# Patient Record
Sex: Male | Born: 1965 | Race: White | Hispanic: No | Marital: Married | Smoking: Never smoker
Health system: Southern US, Community
[De-identification: ages and names within clinical notes are randomized; demographics above are authoritative.]

## PROBLEM LIST (undated history)

## (undated) DIAGNOSIS — E785 Hyperlipidemia, unspecified: Secondary | ICD-10-CM

## (undated) DIAGNOSIS — I499 Cardiac arrhythmia, unspecified: Secondary | ICD-10-CM

## (undated) HISTORY — PX: OTHER SURGICAL HISTORY: SHX169

---

## 2014-05-07 ENCOUNTER — Emergency Department (HOSPITAL_BASED_OUTPATIENT_CLINIC_OR_DEPARTMENT_OTHER): Payer: PRIVATE HEALTH INSURANCE

## 2014-05-07 ENCOUNTER — Inpatient Hospital Stay (HOSPITAL_BASED_OUTPATIENT_CLINIC_OR_DEPARTMENT_OTHER)
Admission: EM | Admit: 2014-05-07 | Discharge: 2014-05-09 | DRG: 247 | Disposition: A | Discharge status: Home / Self Care | Payer: PRIVATE HEALTH INSURANCE | Attending: Cardiology | Admitting: Cardiology

## 2014-05-07 ENCOUNTER — Encounter (HOSPITAL_BASED_OUTPATIENT_CLINIC_OR_DEPARTMENT_OTHER): Payer: Self-pay | Admitting: Emergency Medicine

## 2014-05-07 DIAGNOSIS — I25111 Atherosclerotic heart disease of native coronary artery with angina pectoris with documented spasm: Secondary | ICD-10-CM | POA: Diagnosis not present

## 2014-05-07 DIAGNOSIS — Z87891 Personal history of nicotine dependence: Secondary | ICD-10-CM

## 2014-05-07 DIAGNOSIS — R079 Chest pain, unspecified: Secondary | ICD-10-CM | POA: Diagnosis present

## 2014-05-07 DIAGNOSIS — I2510 Atherosclerotic heart disease of native coronary artery without angina pectoris: Secondary | ICD-10-CM | POA: Diagnosis present

## 2014-05-07 DIAGNOSIS — I214 Non-ST elevation (NSTEMI) myocardial infarction: Principal | ICD-10-CM | POA: Diagnosis present

## 2014-05-07 DIAGNOSIS — E785 Hyperlipidemia, unspecified: Secondary | ICD-10-CM | POA: Diagnosis present

## 2014-05-07 HISTORY — DX: Hyperlipidemia, unspecified: E78.5

## 2014-05-07 HISTORY — DX: Cardiac arrhythmia, unspecified: I49.9

## 2014-05-07 LAB — CBC WITH DIFFERENTIAL/PLATELET
BASOS ABS: 0 10*3/uL (ref 0.0–0.1)
BASOS PCT: 0 % (ref 0–1)
Eosinophils Absolute: 0.1 10*3/uL (ref 0.0–0.7)
Eosinophils Relative: 1 % (ref 0–5)
HEMATOCRIT: 42.7 % (ref 39.0–52.0)
Hemoglobin: 14.4 g/dL (ref 13.0–17.0)
LYMPHS PCT: 33 % (ref 12–46)
Lymphs Abs: 1.9 10*3/uL (ref 0.7–4.0)
MCH: 31 pg (ref 26.0–34.0)
MCHC: 33.7 g/dL (ref 30.0–36.0)
MCV: 91.8 fL (ref 78.0–100.0)
Monocytes Absolute: 0.5 10*3/uL (ref 0.1–1.0)
Monocytes Relative: 9 % (ref 3–12)
NEUTROS ABS: 3.3 10*3/uL (ref 1.7–7.7)
Neutrophils Relative %: 57 % (ref 43–77)
PLATELETS: 262 10*3/uL (ref 150–400)
RBC: 4.65 MIL/uL (ref 4.22–5.81)
RDW: 12.9 % (ref 11.5–15.5)
WBC: 5.8 10*3/uL (ref 4.0–10.5)

## 2014-05-07 LAB — COMPREHENSIVE METABOLIC PANEL
ALT: 27 U/L (ref 0–53)
ANION GAP: 16 — AB (ref 5–15)
AST: 22 U/L (ref 0–37)
Albumin: 4.3 g/dL (ref 3.5–5.2)
Alkaline Phosphatase: 73 U/L (ref 39–117)
BILIRUBIN TOTAL: 0.7 mg/dL (ref 0.3–1.2)
BUN: 16 mg/dL (ref 6–23)
CALCIUM: 10 mg/dL (ref 8.4–10.5)
CHLORIDE: 98 meq/L (ref 96–112)
CO2: 26 meq/L (ref 19–32)
Creatinine, Ser: 0.8 mg/dL (ref 0.50–1.35)
GFR calc non Af Amer: >90 mL/min (ref >90)
GLUCOSE: 126 mg/dL — AB (ref 70–99)
Potassium: 3.7 mEq/L (ref 3.7–5.3)
Sodium: 140 mEq/L (ref 137–147)
Total Protein: 7.9 g/dL (ref 6.0–8.3)

## 2014-05-07 LAB — PROTIME-INR
INR: 1.03 (ref 0.00–1.49)
PROTHROMBIN TIME: 13.5 s (ref 11.6–15.2)

## 2014-05-07 LAB — LIPASE, BLOOD: LIPASE: 19 U/L (ref 11–59)

## 2014-05-07 LAB — TROPONIN I: Troponin I: 0.57 ng/mL (ref <0.30)

## 2014-05-07 LAB — PRO B NATRIURETIC PEPTIDE: PRO B NATRI PEPTIDE: 52 pg/mL (ref 0–125)

## 2014-05-07 LAB — APTT: aPTT: 30 seconds (ref 24–37)

## 2014-05-07 MED ORDER — HEPARIN (PORCINE) IN NACL 100-0.45 UNIT/ML-% IJ SOLN
16.0000 [IU]/kg/h | INTRAMUSCULAR | Status: DC
Start: 2014-05-07 — End: 2014-05-08
  Administered 2014-05-07: 16 [IU]/kg/h via INTRAVENOUS
  Filled 2014-05-07: qty 250

## 2014-05-07 MED ORDER — HEPARIN BOLUS VIA INFUSION
60.0000 [IU]/kg | Freq: Once | INTRAVENOUS | Status: AC
  Administered 2014-05-07: 4764 [IU] via INTRAVENOUS

## 2014-05-07 MED ORDER — FAMOTIDINE 20 MG PO TABS
20.0000 mg | ORAL_TABLET | Freq: Once | ORAL | Status: AC
  Administered 2014-05-07: 20 mg via ORAL
  Filled 2014-05-07: qty 1

## 2014-05-07 MED ORDER — ASPIRIN 81 MG PO CHEW
324.0000 mg | CHEWABLE_TABLET | Freq: Once | ORAL | Status: AC
  Administered 2014-05-07: 324 mg via ORAL
  Filled 2014-05-07: qty 4

## 2014-05-07 NOTE — ED Provider Notes (Signed)
CSN: [id number]     Arrival date & time 05/07/14  2044 History  This chart was scribed for Arby BarretteMarcy Montgomery Favor, MD by Roxy Cedarhandni Bhalodia, ED Scribe. This patient was seen in room MH03/MH03 and the patient's care was started at 9:03 PM.   Chief Complaint  Patient presents with  . Chest Pain   The history is provided by the patient. No language interpreter was used.   HPI Comments: George Rose is a 48 y.o. male with a history of irregular heart beats and hyperlipidemia, who presents to the Emergency Department complaining of intermittent upper central chest pain that feels like a "pulling" sensation. He states that the discomfort sensation in his chest radiates up to his neck.  He reports associated dry mouth that began earlier today. Patient states that he has had a cough in the morning when he wakes up for the past 2 weeks. He states that he took advil earlier today and motrin 1 hour ago with minimal relief. He states that today is the first time he has felt this sensation. He denies any prior history of cardiac problems. He states that his father had a heart attack at age 50 and is still living today. Patient denies any activity that worsens his pain. Patient denies associated lightheadedness, nausea, emesis, sweating, diarrhea, edema in legs. Patient states he is currently a non-smoker and quit smoking 5 years ago. The pain has been waxing and waning throughout the course of the day. He denies it has been worse with exertion. The patient denies associated symptoms. No prior history of similar. Past Medical History  Diagnosis Date  . Irregular heart beat   . Hyperlipemia    Past Surgical History  Procedure Laterality Date  . Kidney stones     History reviewed. No pertinent family history. History  Substance Use Topics  . Smoking status: Never Smoker   . Smokeless tobacco: Never Used  . Alcohol Use: Yes   Review of Systems  10 Systems reviewed and all are negative for acute change except  as noted in the HPI.  Allergies  Review of patient's allergies indicates no known allergies.  Home Medications   Prior to Admission medications   Not on File   Triage Vitals: BP 166/95  Pulse 115  Temp(Src) 98.6 F (37 C) (Oral)  Ht 6' (1.829 m)  Wt 175 lb (79.379 kg)  BMI 23.73 kg/m2  SpO2 99%  Physical Exam  Nursing note and vitals reviewed. Constitutional: He is oriented to person, place, and time. He appears well-developed and well-nourished. No distress.  HENT:  Head: Normocephalic and atraumatic.  Eyes: Conjunctivae and EOM are normal.  Neck: Neck supple. No tracheal deviation present.  Cardiovascular: Normal rate, regular rhythm and normal heart sounds.  Exam reveals no gallop and no friction rub.   No murmur heard. Pulmonary/Chest: Effort normal. No respiratory distress.  Musculoskeletal: Normal range of motion.  Neurological: He is alert and oriented to person, place, and time.  Skin: Skin is warm and dry.  Psychiatric: He has a normal mood and affect. His behavior is normal.   ED Course  Procedures (including critical care time)  DIAGNOSTIC STUDIES: Oxygen Saturation is 99% on RA, normal by my interpretation.    COORDINATION OF CARE: 9:21 PM- Discussed plans to order diagnostic CXR and lab work. Will give patient chewable aspirin and Pepcid. Pt advised of plan for treatment and pt agrees.  Labs Review Labs Reviewed  COMPREHENSIVE METABOLIC PANEL - Abnormal; Notable for the following:  Glucose, Bld 126 (*)    Anion gap 16 (*)    All other components within normal limits  TROPONIN I - Abnormal; Notable for the following:    Troponin I 0.57 (*)    All other components within normal limits  LIPASE, BLOOD  CBC WITH DIFFERENTIAL  PRO B NATRIURETIC PEPTIDE  APTT  PROTIME-INR    Imaging Review Dg Chest 2 View  05/07/2014   CLINICAL DATA:  48 year old male with cough x2 weeks. Intermittent chest pain.  EXAM: CHEST - 2 VIEW  COMPARISON:  None.   FINDINGS: Cardiomediastinal within normal limits in size and contour. No pulmonary vascular congestion.  No visualized pneumothorax or pleural effusion. No confluent airspace disease.  No acute bony abnormality. Unremarkable appearance of the upper abdomen.  IMPRESSION: No radiographic evidence of acute cardiopulmonary disease.  Signed,  Yvone NeuJaime S. Loreta AveWagner, DO  [facility]  [facility]   Electronically Signed   By: Gilmer MorJaime  Wagner D.O.   On: 05/07/2014 21:29     EKG Interpretation   Date/Time:  Sunday May 07 2014 20:51:42 EDT Ventricular Rate:  106 PR Interval:  160 QRS Duration: 72 QT Interval:  328 QTC Calculation: 435 R Axis:   58 Text Interpretation:  Sinus tachycardia ST \\T \ T wave abnormality,  consider lateral ischemia Abnormal ECG agree Confirmed by Donnald GarrePfeiffer, MD,  Lebron ConnersMarcy [phone] on 05/07/2014 9:00:14 PM      Consultation has been placed with cardiology. The patient will be started on heparin drip and transferred to [facility] for further management.  CRITICAL CARE Performed by: Arby BarrettePfeiffer, Haidy Kackley   Total critical care time: 30  Critical care time was exclusive of separately billable procedures and treating other patients.  Critical care was necessary to treat or prevent imminent or life-threatening deterioration.  Critical care was time spent personally by me on the following activities: development of treatment plan with patient and/or surrogate as well as nursing, discussions with consultants, evaluation of patient's response to treatment, examination of patient, obtaining history from patient or surrogate, ordering and performing treatments and interventions, ordering and review of laboratory studies, ordering and review of radiographic studies, pulse oximetry and re-evaluation of patient's condition. MDM   Final diagnoses:  NSTEMI (non-ST elevated myocardial infarction)   at this point time patient's findings are consistent  with a and STEMI. EKG does show ST depression but no acute ST elevation MI. With a ischemic appearing EKG and elevated troponins patient will be transferred for further cardiology evaluation and treatment. He has been given aspirin in the emergency department here and will be treated with heparin as well.    I personally performed the services described in this documentation, which was scribed in my presence. The recorded information has been reviewed and is accurate.  Arby BarretteMarcy Hiral Lukasiewicz, MD 05/08/14 [phone]

## 2014-05-07 NOTE — ED Notes (Addendum)
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

## 2014-05-07 NOTE — ED Notes (Signed)
Critical troponin 0.57 received from lab.  EDP notified.

## 2014-05-08 ENCOUNTER — Encounter (HOSPITAL_COMMUNITY)
Admission: EM | Disposition: A | Discharge status: Home / Self Care | Payer: PRIVATE HEALTH INSURANCE | Source: Home / Self Care | Attending: Cardiology

## 2014-05-08 DIAGNOSIS — I214 Non-ST elevation (NSTEMI) myocardial infarction: Secondary | ICD-10-CM

## 2014-05-08 DIAGNOSIS — I2510 Atherosclerotic heart disease of native coronary artery without angina pectoris: Secondary | ICD-10-CM

## 2014-05-08 HISTORY — PX: LEFT HEART CATHETERIZATION WITH CORONARY ANGIOGRAM: SHX5451

## 2014-05-08 LAB — LIPID PANEL
Cholesterol: 231 mg/dL — ABNORMAL HIGH (ref 0–200)
HDL: 53 mg/dL (ref >39)
LDL CALC: 158 mg/dL — AB (ref 0–99)
Total CHOL/HDL Ratio: 4.4 RATIO
Triglycerides: 102 mg/dL (ref <150)
VLDL: 20 mg/dL (ref 0–40)

## 2014-05-08 LAB — CBC
HEMATOCRIT: 40.4 % (ref 39.0–52.0)
HEMOGLOBIN: 13.9 g/dL (ref 13.0–17.0)
MCH: 31 pg (ref 26.0–34.0)
MCHC: 34.4 g/dL (ref 30.0–36.0)
MCV: 90.2 fL (ref 78.0–100.0)
Platelets: 240 10*3/uL (ref 150–400)
RBC: 4.48 MIL/uL (ref 4.22–5.81)
RDW: 12.9 % (ref 11.5–15.5)
WBC: 5.6 10*3/uL (ref 4.0–10.5)

## 2014-05-08 LAB — BASIC METABOLIC PANEL
Anion gap: 13 (ref 5–15)
BUN: 14 mg/dL (ref 6–23)
CHLORIDE: 103 meq/L (ref 96–112)
CO2: 27 mEq/L (ref 19–32)
CREATININE: 0.79 mg/dL (ref 0.50–1.35)
Calcium: 9.2 mg/dL (ref 8.4–10.5)
Glucose, Bld: 104 mg/dL — ABNORMAL HIGH (ref 70–99)
Potassium: 4 mEq/L (ref 3.7–5.3)
Sodium: 143 mEq/L (ref 137–147)

## 2014-05-08 LAB — MRSA PCR SCREENING: MRSA by PCR: NEGATIVE

## 2014-05-08 LAB — TROPONIN I: Troponin I: 1.03 ng/mL (ref <0.30)

## 2014-05-08 LAB — HEPARIN LEVEL (UNFRACTIONATED): Heparin Unfractionated: 0.48 IU/mL (ref 0.30–0.70)

## 2014-05-08 LAB — HEMOGLOBIN A1C
HEMOGLOBIN A1C: 5.7 % — AB (ref <5.7)
MEAN PLASMA GLUCOSE: 117 mg/dL — AB (ref <117)

## 2014-05-08 LAB — POCT ACTIVATED CLOTTING TIME: Activated Clotting Time: 371 seconds

## 2014-05-08 SURGERY — LEFT HEART CATHETERIZATION WITH CORONARY ANGIOGRAM
Anesthesia: LOCAL

## 2014-05-08 MED ORDER — TICAGRELOR 90 MG PO TABS
90.0000 mg | ORAL_TABLET | Freq: Two times a day (BID) | ORAL | Status: DC
  Administered 2014-05-09: 90 mg via ORAL
  Filled 2014-05-08 (×2): qty 1

## 2014-05-08 MED ORDER — BIVALIRUDIN 250 MG IV SOLR
INTRAVENOUS | Status: AC
  Filled 2014-05-08: qty 250

## 2014-05-08 MED ORDER — SODIUM CHLORIDE 0.9 % IJ SOLN
3.0000 mL | Freq: Two times a day (BID) | INTRAMUSCULAR | Status: DC
  Administered 2014-05-08 – 2014-05-09 (×3): 3 mL via INTRAVENOUS

## 2014-05-08 MED ORDER — HEPARIN (PORCINE) IN NACL 2-0.9 UNIT/ML-% IJ SOLN
INTRAMUSCULAR | Status: AC
  Filled 2014-05-08: qty 1000

## 2014-05-08 MED ORDER — TICAGRELOR 90 MG PO TABS
ORAL_TABLET | ORAL | Status: AC
  Filled 2014-05-08: qty 2

## 2014-05-08 MED ORDER — ATORVASTATIN CALCIUM 80 MG PO TABS
80.0000 mg | ORAL_TABLET | ORAL | Status: AC
  Administered 2014-05-08: 80 mg via ORAL
  Filled 2014-05-08: qty 1

## 2014-05-08 MED ORDER — ASPIRIN 81 MG PO CHEW
81.0000 mg | CHEWABLE_TABLET | ORAL | Status: AC
  Administered 2014-05-08: 81 mg via ORAL
  Filled 2014-05-08: qty 1

## 2014-05-08 MED ORDER — NITROGLYCERIN 0.4 MG SL SUBL: 0.4000 mg | SUBLINGUAL_TABLET | SUBLINGUAL | Status: DC | PRN

## 2014-05-08 MED ORDER — SODIUM CHLORIDE 0.9 % IJ SOLN: 3.0000 mL | INTRAMUSCULAR | Status: DC | PRN

## 2014-05-08 MED ORDER — FENTANYL CITRATE 0.05 MG/ML IJ SOLN
INTRAMUSCULAR | Status: AC
  Filled 2014-05-08: qty 2

## 2014-05-08 MED ORDER — LIDOCAINE HCL (PF) 1 % IJ SOLN
INTRAMUSCULAR | Status: AC
  Filled 2014-05-08: qty 30

## 2014-05-08 MED ORDER — HEPARIN SODIUM (PORCINE) 1000 UNIT/ML IJ SOLN
INTRAMUSCULAR | Status: AC
  Filled 2014-05-08: qty 1

## 2014-05-08 MED ORDER — ATORVASTATIN CALCIUM 80 MG PO TABS
80.0000 mg | ORAL_TABLET | Freq: Every day | ORAL | Status: DC
  Filled 2014-05-08: qty 1

## 2014-05-08 MED ORDER — MIDAZOLAM HCL 2 MG/2ML IJ SOLN
INTRAMUSCULAR | Status: AC
  Filled 2014-05-08: qty 2

## 2014-05-08 MED ORDER — SODIUM CHLORIDE 0.9 % IV SOLN
INTRAVENOUS | Status: AC
  Administered 2014-05-08 (×2): via INTRAVENOUS

## 2014-05-08 MED ORDER — HEPARIN (PORCINE) IN NACL 100-0.45 UNIT/ML-% IJ SOLN
1250.0000 [IU]/h | INTRAMUSCULAR | Status: DC
  Administered 2014-05-08 (×2): 1250 [IU]/h via INTRAVENOUS
  Filled 2014-05-08 (×2): qty 250

## 2014-05-08 MED ORDER — VERAPAMIL HCL 2.5 MG/ML IV SOLN
INTRAVENOUS | Status: AC
  Filled 2014-05-08: qty 2

## 2014-05-08 MED ORDER — SODIUM CHLORIDE 0.9 % IV SOLN
INTRAVENOUS | Status: AC
  Administered 2014-05-08: 100 mL/h via INTRAVENOUS

## 2014-05-08 MED ORDER — ACETAMINOPHEN 325 MG PO TABS: 650.0000 mg | ORAL_TABLET | ORAL | Status: DC | PRN

## 2014-05-08 MED ORDER — SODIUM CHLORIDE 0.9 % IV SOLN: 250.0000 mL | INTRAVENOUS | Status: DC | PRN

## 2014-05-08 MED ORDER — ONDANSETRON HCL 4 MG/2ML IJ SOLN: 4.0000 mg | Freq: Four times a day (QID) | INTRAMUSCULAR | Status: DC | PRN

## 2014-05-08 MED ORDER — SODIUM CHLORIDE 0.9 % IV SOLN
INTRAVENOUS | Status: DC
  Administered 2014-05-08: 11:00:00 via INTRAVENOUS

## 2014-05-08 MED ORDER — ASPIRIN EC 81 MG PO TBEC
81.0000 mg | DELAYED_RELEASE_TABLET | Freq: Every day | ORAL | Status: DC
  Administered 2014-05-09: 81 mg via ORAL
  Filled 2014-05-08 (×2): qty 1

## 2014-05-08 MED ORDER — OXYCODONE-ACETAMINOPHEN 5-325 MG PO TABS
1.0000 | ORAL_TABLET | ORAL | Status: DC | PRN
  Administered 2014-05-08: 1 via ORAL
  Filled 2014-05-08: qty 1

## 2014-05-08 MED ORDER — CARVEDILOL 6.25 MG PO TABS
6.2500 mg | ORAL_TABLET | Freq: Two times a day (BID) | ORAL | Status: DC
  Administered 2014-05-08 – 2014-05-09 (×3): 6.25 mg via ORAL
  Filled 2014-05-08 (×5): qty 1

## 2014-05-08 NOTE — Progress Notes (Signed)
PROGRESS NOTE  Subjective:   48 yo from [place], presents with NSTEMI. CP is better. Troponin is elevated.  For cath today  Objective:    Vital Signs:   Temp:  [98.2 F (36.8 C)-98.6 F (37 C)] 98.2 F (36.8 C) (10/19 0747) Pulse Rate:  [72-115] 83 (10/19 1000) Resp:  [13-25] 22 (10/19 1000) BP: (121-189)/(71-109) 129/81 mmHg (10/19 1000) SpO2:  [95 %-100 %] 98 % (10/19 1000) Weight:  [168 lb 3.4 oz (76.3 kg)-175 lb (79.379 kg)] 168 lb 3.4 oz (76.3 kg) (10/19 0217)      24-hour weight change: Weight change:   Weight trends: Filed Weights   05/07/14 2047 05/08/14 0217  Weight: 175 lb (79.379 kg) 168 lb 3.4 oz (76.3 kg)    Intake/Output:  10/18 0701 - 10/19 0700 In: 784.4 [I.V.:784.4] Out: 350 [Urine:350] Total I/O In: 347.5 [I.V.:347.5] Out: 500 [Urine:500]   Physical Exam: BP 129/81  Pulse 83  Temp(Src) 98.2 F (36.8 C) (Oral)  Resp 22  Ht 6' (1.829 m)  Wt 168 lb 3.4 oz (76.3 kg)  BMI 22.81 kg/m2  SpO2 98%  Wt Readings from Last 3 Encounters:  05/08/14 168 lb 3.4 oz (76.3 kg)  05/08/14 168 lb 3.4 oz (76.3 kg)    General: Vital signs reviewed and noted.   Head: Normocephalic, atraumatic.  Eyes: conjunctivae/corneas clear.  EOM's intact.   Throat: normal  Neck:  normal   Lungs:    clear  Heart:  Rr  Abdomen:  Soft, non-tender, non-distended    Extremities: No edema. Pulses are 2+    Neurologic: A&O X3, CN II - XII are grossly intact.   Psych: Normal     Labs: BMET:  Recent Labs  05/07/14 2108 05/08/14 0512  NA 140 143  K 3.7 4.0  CL 98 103  CO2 26 27  GLUCOSE 126* 104*  BUN 16 14  CREATININE 0.80 0.79  CALCIUM 10.0 9.2    Liver function tests:  Recent Labs  05/07/14 2108  AST 22  ALT 27  ALKPHOS 73  BILITOT 0.7  PROT 7.9  ALBUMIN 4.3    Recent Labs  05/07/14 2108  LIPASE 19    CBC:  Recent Labs  05/07/14 2108 05/08/14 0512  WBC 5.8 5.6  NEUTROABS 3.3  --   HGB 14.4 13.9  HCT 42.7 40.4  MCV 91.8  90.2  PLT 262 240    Cardiac Enzymes:  Recent Labs  05/07/14 2043 05/08/14 0512  TROPONINI 0.57* 1.03*    Coagulation Studies:  Recent Labs  05/07/14 2108  LABPROT 13.5  INR 1.03    Other: No components found with this basename: POCBNP,  No results found for this basename: DDIMER,  in the last 72 hours No results found for this basename: HGBA1C,  in the last 72 hours  Recent Labs  05/08/14 0528  CHOL 231*  HDL 53  LDLCALC 158*  TRIG 102  CHOLHDL 4.4   No results found for this basename: TSH, T4TOTAL, FREET3, T3FREE, THYROIDAB,  in the last 72 hours No results found for this basename: VITAMINB12, FOLATE, FERRITIN, TIBC, IRON, RETICCTPCT,  in the last 72 hours   Other results:  EKG :    NSR, ST depression     Medications:    Infusions: . heparin 1,250 Units/hr (05/08/14 0215)    Scheduled Medications: . [COMPLETED] sodium chloride   Intravenous STAT  . aspirin EC  81 mg Oral Daily  . atorvastatin  80 mg Oral q1800  . carvedilol  6.25 mg Oral BID WC    Assessment/ Plan:   Active Problems:   NSTEMI (non-ST elevated myocardial infarction) we have discussed risks / benefits  / options of cardiac cath. He understands and agrees to proceed.   Disposition: for cath todahy  Length of Stay: 1   2. Hyperlipidemia:  Will start atorvastatin 80   Patrecia Veiga J. Jordynn Marcella, Jr., MD, FACC 05/08/2014, 11:04 AM Office [phone] Pager [phone]    

## 2014-05-08 NOTE — Progress Notes (Signed)
Utilization Review Completed.George Rose T10/19/2015  

## 2014-05-08 NOTE — Interval H&P Note (Signed)
History and Physical Interval Note:  05/08/2014 6:48 PM  George Rose  has presented today for surgery, with the diagnosis of cp  The various methods of treatment have been discussed with the patient and family. After consideration of risks, benefits and other options for treatment, the patient has consented to  Procedure(s): LEFT HEART CATHETERIZATION WITH CORONARY ANGIOGRAM (N/A) as a surgical intervention .  The patient's history has been reviewed, patient examined, no change in status, stable for surgery.  I have reviewed the patient's chart and labs.  Questions were answered to the patient's satisfaction.    Cath Lab Visit (complete for each Cath Lab visit)  Clinical Evaluation Leading to the Procedure:   ACS: Yes.    Non-ACS:    Anginal Classification: CCS IV  Anti-ischemic medical therapy: Minimal Therapy (1 class of medications)  Non-Invasive Test Results: No non-invasive testing performed  Prior CABG: No previous CABG  George BollmanMichael Dacotah Cabello MD 05/08/2014 6:50 PM

## 2014-05-08 NOTE — Progress Notes (Signed)
To the cath. Lab by bed. 

## 2014-05-08 NOTE — CV Procedure (Signed)
    Cardiac Catheterization Procedure Note  Name: George ChingMauro Rose MRN: [id number] DOB: 18-Mar-1966  Procedure: Left Heart Cath, Selective Coronary Angiography, LV angiography, PTCA and stenting of the mid-left circumflex  Indication: NSTEMI. 48 [place] with fam hx of CAD and hyperlipidemia presented with typical symptoms of ACS and was troponin positive. He is referred for cath and possible PCI.  Procedural Details:  The right wrist was prepped, draped, and anesthetized with 1% lidocaine. Using the modified Seldinger technique, a 5/6 French Slender sheath was introduced into the right radial artery. 3 mg of verapamil was administered through the sheath, weight-based unfractionated heparin was administered intravenously. Standard Judkins catheters were used for selective coronary angiography and left ventriculography. Catheter exchanges were performed over an exchange length guidewire.  PROCEDURAL FINDINGS Hemodynamics: AO 108/73 LV 112/8   Coronary angiography: Coronary dominance: right  Left mainstem: Widely patent without obstructive disease  Left anterior descending (LAD): Wraps the LV apex. The LAD just after the first SP has 30% stenosis. The first diag is large without obstructive disease. No significant stenosis throughout the course of the LAD  Left circumflex (LCx): Large vessel with 99% mid stenosis extending into the first OM. No other significant disease noted.   Right coronary artery (RCA): Medium caliber, dominant vessel. The mid-vessel has mild irregularity without high-grade obstruction.  Left ventriculography: Left ventricular systolic function is low-normal, LVEF is estimated at 55%, there is no significant mitral regurgitation. There is mild hypokinesis of the distal inferior wall noted.   PCI Note:  Following the diagnostic procedure, the decision was made to proceed with PCI of the mid-LCx.  The patient was loaded with brilinta 180 mg. Weight-based bivalirudin  was given for anticoagulation. Once a therapeutic ACT was achieved, a 6 [place] XB-LAD guide catheter was inserted.  A cougar coronary guidewire was used to cross the lesion.  The lesion was predilated with a 2.5 mm balloon.  The lesion was then stented with a 3.5x20 mm Promus DES. The stent appeared well-sized to the vessel, but there was mild slow flow following stent deployment. IC verapamil was administered. Following PCI, there was 0% residual stenosis and TIMI-3 flow. Final angiography confirmed an excellent result. The patient tolerated the procedure well. There were no immediate procedural complications. A TR band was used for radial hemostasis. The patient was transferred to the post catheterization recovery area for further monitoring.  PCI Data: Vessel - LCx/Segment - mid Percent Stenosis (pre)  99 TIMI-flow 3 Stent 3.5x20 mm Promus DES Percent Stenosis (post) 0 TIMI-flow (post) 3  Contrast: 125 cc Omnipaque  Estimated Blood Loss: minimal  Final Conclusions:   1. Severe single vessel CAD of the left circumflex, treated successfully with a DES platform 2. Low-normal LV function   Recommendations:  Aggressive risk reduction, 12 months DAPT with ASA and brilinta, anticipate discharge home tomorrow.  Tonny BollmanMichael Star Resler MD, [facility] 05/08/2014, 7:40 PM

## 2014-05-08 NOTE — H&P (Signed)
Admission History and Physical     Patient ID: George Rose, MRN: [id number], DOB: [birth year] 48 y.o. Date of Encounter: 05/08/2014, 3:37 AM  Primary Physician: No primary provider on file. Primary Cardiologist: None  Chief Complaint:  Chest Pain  History of Present Illness: George Rose is a 48 y.o. male without significant PMHx who presented to [place] ER tonight with chest pressure, found to have an NSTEMI.  He states that he has had a URI for the past several days, and he woke up this morning with a lump in his chest which he thought was related to the URI. However, this pressure in his chest continued to wax and wane throughout the day so he presented to the local ER.  No radiation of pressure/pain, no n/v, no SOB or diaphoresis.  His EKG at [facility] showed ST depression in the lateral leads as well as II. Troponin was positive.  Heparin and aspirin started and now chest pain free.   Past Medical History  Diagnosis Date  . Irregular heart beat   . Hyperlipemia      Past Surgical History  Procedure Laterality Date  . Kidney stones        Current Facility-Administered Medications  Medication Dose Route Frequency Provider Last Rate Last Dose  . 0.9 %  sodium chloride infusion   Intravenous STAT Arby BarretteMarcy Pfeiffer, MD 150 mL/hr at 05/08/14 0215    . acetaminophen (TYLENOL) tablet 650 mg  650 mg Oral Q4H PRN Yaakov GuthrieJoseph Sivak, MD      . Melene Muller[START ON 05/09/2014] aspirin EC tablet 81 mg  81 mg Oral Daily Yaakov GuthrieJoseph Sivak, MD      . atorvastatin (LIPITOR) tablet 80 mg  80 mg Oral q1800 Yaakov GuthrieJoseph Sivak, MD      . carvedilol (COREG) tablet 6.25 mg  6.25 mg Oral BID WC Yaakov GuthrieJoseph Sivak, MD      . heparin ADULT infusion 100 units/mL (25000 units/250 mL)  1,250 Units/hr Intravenous Continuous Lars MassonKatarina H Nelson, MD 12.5 mL/hr at 05/08/14 0215 1,250 Units/hr at 05/08/14 0215  . nitroGLYCERIN (NITROSTAT) SL tablet 0.4 mg  0.4 mg Sublingual Q5 Min x 3 PRN Yaakov GuthrieJoseph Sivak, MD      . ondansetron [facility](ZOFRAN)  injection 4 mg  4 mg Intravenous Q6H PRN Yaakov GuthrieJoseph Sivak, MD          Allergies: No Known Allergies   Social History:  The patient is originally from [place], has been in the [place] a [place] now Scientist, research (medical)selling furniture.  Does not smoke currently, but used to smoke occationally.    Family History:  The patient denies significant family history for CAD  ROS:  Please see the history of present illness.    All other systems reviewed and negative.   Vital Signs: Blood pressure 189/87, pulse 87, temperature 98.6 F (37 C), temperature source Oral, resp. rate 13, height 6' (1.829 m), weight 76.3 kg (168 lb 3.4 oz), SpO2 99.00%.  PHYSICAL EXAM: General:  Well nourished, well developed, in no acute distress HEENT: normal Lymph: no adenopathy Neck: no JVD Endocrine:  No thryomegaly Vascular: No carotid bruits; FA pulses 2+ bilaterally without bruits Cardiac:  normal S1, S2; RRR; no murmur Lungs:  clear to auscultation bilaterally, no wheezing, rhonchi or rales Abd: soft, nontender, no hepatomegaly Ext: no edema Musculoskeletal:  No deformities, BUE and BLE strength normal and equal Skin: warm and dry Neuro:  CNs 2-12 intact, no focal abnormalities noted Psych:  Normal affect   EKG:   Sinus  tachycardia with ST depressions laterally and in lead II  Labs:   Lab Results  Component Value Date   WBC 5.8 05/07/2014   HGB 14.4 05/07/2014   HCT 42.7 05/07/2014   MCV 91.8 05/07/2014   PLT 262 05/07/2014    Recent Labs Lab 05/07/14 2108  NA 140  K 3.7  CL 98  CO2 26  BUN 16  CREATININE 0.80  CALCIUM 10.0  PROT 7.9  BILITOT 0.7  ALKPHOS 73  ALT 27  AST 22  GLUCOSE 126*    Recent Labs  05/07/14 2043  TROPONINI 0.57*   No results found for this basename: CHOL, HDL, LDLCALC, TRIG   No results found for this basename: DDIMER    Radiology/Studies:  Dg Chest 2 View  05/07/2014   CLINICAL DATA:  48 year old male with cough x2 weeks. Intermittent chest pain.  EXAM: CHEST - 2 VIEW   COMPARISON:  None.  FINDINGS: Cardiomediastinal within normal limits in size and contour. No pulmonary vascular congestion.  No visualized pneumothorax or pleural effusion. No confluent airspace disease.  No acute bony abnormality. Unremarkable appearance of the upper abdomen.  IMPRESSION: No radiographic evidence of acute cardiopulmonary disease.  Signed,  Yvone NeuJaime S. Loreta AveWagner, DO  [facility]  [facility]   Electronically Signed   By: Gilmer MorJaime  Wagner D.O.   On: 05/07/2014 21:29     ASSESSMENT AND PLAN:   1. NSTEMI - continue heparin gtt, aspirin - start statin. Blood pressure mildly elevated, start low dose coreg. - Check lipids, HbA1C.  Repeat troponin with AM labs. - NPO for cath today.  Signed,  Yaakov GuthrieJoseph Sivak, MD 05/08/2014, 3:37 AM

## 2014-05-08 NOTE — Progress Notes (Addendum)
ANTICOAGULATION CONSULT NOTE - Initial Consult  Pharmacy Consult for Heparin Indication: chest pain/ACS  No Known Allergies  Patient Measurements: Height: 6' (182.9 cm) Weight: 168 lb 3.4 oz (76.3 kg) IBW/kg (Calculated) : 77.6  Vital Signs: Temp: 98.6 F (37 C) (10/19 0218) Temp Source: Oral (10/19 0218) BP: 189/87 mmHg (10/19 0300) Pulse Rate: 87 (10/19 0300)  Labs:  Recent Labs  05/07/14 2043 05/07/14 2108  HGB  --  14.4  HCT  --  42.7  PLT  --  262  APTT  --  30  LABPROT  --  13.5  INR  --  1.03  CREATININE  --  0.80  TROPONINI 0.57*  --     Estimated Creatinine Clearance: 119.2 ml/min (by C-G formula based on Cr of 0.8).   Medical History: Past Medical History  Diagnosis Date  . Irregular heart beat   . Hyperlipemia    Assessment: 48 y.o. male with chest pain for heparin Heparin 4764 units IV bolus and 1250 units/hr started in ED at 2315  Goal of Therapy:  Heparin level 0.3-0.7 units/ml Monitor platelets by anticoagulation protocol: Yes   Plan:  Continue Heparin at current rate  Follow-up am labs.   [place] Silveria, Gary FleetGregory Vernon 05/08/2014,4:02 AM  Addendum Initial Heparin level 0.48  Will continue heparin at current rate and follow up after cath today  Geannie RisenGreg Nitisha Civello, PharmD, BCPS 05/08/2014 6:20 AM

## 2014-05-08 NOTE — H&P (View-Only) (Signed)
PROGRESS NOTE  Subjective:   48 yo from [place], presents with NSTEMI. CP is better. Troponin is elevated.  For cath today  Objective:    Vital Signs:   Temp:  [98.2 F (36.8 C)-98.6 F (37 C)] 98.2 F (36.8 C) (10/19 0747) Pulse Rate:  [72-115] 83 (10/19 1000) Resp:  [13-25] 22 (10/19 1000) BP: (121-189)/(71-109) 129/81 mmHg (10/19 1000) SpO2:  [95 %-100 %] 98 % (10/19 1000) Weight:  [168 lb 3.4 oz (76.3 kg)-175 lb (79.379 kg)] 168 lb 3.4 oz (76.3 kg) (10/19 0217)      24-hour weight change: Weight change:   Weight trends: Filed Weights   05/07/14 2047 05/08/14 0217  Weight: 175 lb (79.379 kg) 168 lb 3.4 oz (76.3 kg)    Intake/Output:  10/18 0701 - 10/19 0700 In: 784.4 [I.V.:784.4] Out: 350 [Urine:350] Total I/O In: 347.5 [I.V.:347.5] Out: 500 [Urine:500]   Physical Exam: BP 129/81  Pulse 83  Temp(Src) 98.2 F (36.8 C) (Oral)  Resp 22  Ht 6' (1.829 m)  Wt 168 lb 3.4 oz (76.3 kg)  BMI 22.81 kg/m2  SpO2 98%  Wt Readings from Last 3 Encounters:  05/08/14 168 lb 3.4 oz (76.3 kg)  05/08/14 168 lb 3.4 oz (76.3 kg)    General: Vital signs reviewed and noted.   Head: Normocephalic, atraumatic.  Eyes: conjunctivae/corneas clear.  EOM's intact.   Throat: normal  Neck:  normal   Lungs:    clear  Heart:  Rr  Abdomen:  Soft, non-tender, non-distended    Extremities: No edema. Pulses are 2+    Neurologic: A&O X3, CN II - XII are grossly intact.   Psych: Normal     Labs: BMET:  Recent Labs  05/07/14 2108 05/08/14 0512  NA 140 143  K 3.7 4.0  CL 98 103  CO2 26 27  GLUCOSE 126* 104*  BUN 16 14  CREATININE 0.80 0.79  CALCIUM 10.0 9.2    Liver function tests:  Recent Labs  05/07/14 2108  AST 22  ALT 27  ALKPHOS 73  BILITOT 0.7  PROT 7.9  ALBUMIN 4.3    Recent Labs  05/07/14 2108  LIPASE 19    CBC:  Recent Labs  05/07/14 2108 05/08/14 0512  WBC 5.8 5.6  NEUTROABS 3.3  --   HGB 14.4 13.9  HCT 42.7 40.4  MCV 91.8  90.2  PLT 262 240    Cardiac Enzymes:  Recent Labs  05/07/14 2043 05/08/14 0512  TROPONINI 0.57* 1.03*    Coagulation Studies:  Recent Labs  05/07/14 2108  LABPROT 13.5  INR 1.03    Other: No components found with this basename: POCBNP,  No results found for this basename: DDIMER,  in the last 72 hours No results found for this basename: HGBA1C,  in the last 72 hours  Recent Labs  05/08/14 0528  CHOL 231*  HDL 53  LDLCALC 158*  TRIG 102  CHOLHDL 4.4   No results found for this basename: TSH, T4TOTAL, FREET3, T3FREE, THYROIDAB,  in the last 72 hours No results found for this basename: VITAMINB12, FOLATE, FERRITIN, TIBC, IRON, RETICCTPCT,  in the last 72 hours   Other results:  EKG :    NSR, ST depression     Medications:    Infusions: . heparin 1,250 Units/hr (05/08/14 0215)    Scheduled Medications: . [COMPLETED] sodium chloride   Intravenous STAT  . aspirin EC  81 mg Oral Daily  . atorvastatin  80 mg Oral q1800  . carvedilol  6.25 mg Oral BID WC    Assessment/ Plan:   Active Problems:   NSTEMI (non-ST elevated myocardial infarction) we have discussed risks / benefits  / options of cardiac cath. He understands and agrees to proceed.   Disposition: for cath todahy  Length of Stay: 1   2. Hyperlipidemia:  Will start atorvastatin 80   Vesta MixerPhilip J. Nahser, Montez HagemanJr., MD, [facility] 05/08/2014, 11:04 AM Office [phone] Pager [phone]

## 2014-05-09 ENCOUNTER — Encounter (HOSPITAL_COMMUNITY): Payer: Self-pay | Admitting: Physician Assistant

## 2014-05-09 DIAGNOSIS — E785 Hyperlipidemia, unspecified: Secondary | ICD-10-CM | POA: Diagnosis present

## 2014-05-09 DIAGNOSIS — I25111 Atherosclerotic heart disease of native coronary artery with angina pectoris with documented spasm: Secondary | ICD-10-CM

## 2014-05-09 LAB — CBC
HCT: 39.3 % (ref 39.0–52.0)
Hemoglobin: 13.2 g/dL (ref 13.0–17.0)
MCH: 30.5 pg (ref 26.0–34.0)
MCHC: 33.6 g/dL (ref 30.0–36.0)
MCV: 90.8 fL (ref 78.0–100.0)
Platelets: 221 10*3/uL (ref 150–400)
RBC: 4.33 MIL/uL (ref 4.22–5.81)
RDW: 12.8 % (ref 11.5–15.5)
WBC: 6.7 10*3/uL (ref 4.0–10.5)

## 2014-05-09 LAB — BASIC METABOLIC PANEL
Anion gap: 12 (ref 5–15)
BUN: 14 mg/dL (ref 6–23)
CHLORIDE: 101 meq/L (ref 96–112)
CO2: 24 mEq/L (ref 19–32)
Calcium: 8.9 mg/dL (ref 8.4–10.5)
Creatinine, Ser: 0.77 mg/dL (ref 0.50–1.35)
GFR calc Af Amer: >90 mL/min (ref >90)
GFR calc non Af Amer: >90 mL/min (ref >90)
GLUCOSE: 97 mg/dL (ref 70–99)
POTASSIUM: 3.7 meq/L (ref 3.7–5.3)
SODIUM: 137 meq/L (ref 137–147)

## 2014-05-09 MED ORDER — CARVEDILOL 6.25 MG PO TABS: 6.2500 mg | ORAL_TABLET | Freq: Two times a day (BID) | ORAL | Status: AC

## 2014-05-09 MED ORDER — TICAGRELOR 90 MG PO TABS: 90.0000 mg | ORAL_TABLET | Freq: Two times a day (BID) | ORAL | Status: DC

## 2014-05-09 MED ORDER — TICAGRELOR 90 MG PO TABS: 90.0000 mg | ORAL_TABLET | Freq: Two times a day (BID) | ORAL | Status: AC

## 2014-05-09 MED ORDER — NITROGLYCERIN 0.4 MG SL SUBL
0.4000 mg | SUBLINGUAL_TABLET | SUBLINGUAL | Status: AC | PRN
Start: 2014-05-09 — End: ?

## 2014-05-09 MED ORDER — ATORVASTATIN CALCIUM 80 MG PO TABS: 80.0000 mg | ORAL_TABLET | Freq: Every day | ORAL | Status: AC

## 2014-05-09 MED ORDER — ASPIRIN 81 MG PO TBEC: 81.0000 mg | DELAYED_RELEASE_TABLET | Freq: Every day | ORAL | Status: AC

## 2014-05-09 MED FILL — Sodium Chloride IV Soln 0.9%: INTRAVENOUS | Qty: 50 | Status: AC

## 2014-05-09 NOTE — Progress Notes (Signed)
[id number] Pt had already walked with RN without CP so did not walk again. MI education completed with pt who voiced understanding. Gave pt brilinta booklet and stressed importance of taking it twice a day due to stent. Reviewed NTG use and calling 911. Discussed diet habits and pt eats a heart healthy diet. Told pt about CRP 2 programs and he can discuss with cardiologist in [place] if he wants to attend. He has treadmill at home and walking instructions given. Luetta Nuttingharlene Dechelle Attaway RN BSN 05/09/2014 10:52 AM

## 2014-05-09 NOTE — Progress Notes (Signed)
PROGRESS NOTE  Subjective:   48 yo from [place], presents with NSTEMI. CP is better. Troponin is elevated.  Had PCI of LCx. Is doing well.  Ready to go home  Objective:    Vital Signs:   Temp:  [97.7 F (36.5 C)-98.8 F (37.1 C)] 98 F (36.7 C) (10/20 0700) Pulse Rate:  [67-90] 80 (10/20 0700) Resp:  [12-24] 15 (10/20 0700) BP: (104-149)/(71-97) 115/71 mmHg (10/20 0700) SpO2:  [96 %-100 %] 100 % (10/20 0700)      24-hour weight change: Weight change:   Weight trends: Filed Weights   05/07/14 2047 05/08/14 0217  Weight: 175 lb (79.379 kg) 168 lb 3.4 oz (76.3 kg)    Intake/Output:  10/19 0701 - 10/20 0700 In: 1950.8 [P.O.:240; I.V.:1710.8] Out: 2025 [Urine:2025]     Physical Exam: BP 115/71  Pulse 80  Temp(Src) 98 F (36.7 C) (Oral)  Resp 15  Ht 6' (1.829 m)  Wt 168 lb 3.4 oz (76.3 kg)  BMI 22.81 kg/m2  SpO2 100%  Wt Readings from Last 3 Encounters:  05/08/14 168 lb 3.4 oz (76.3 kg)  05/08/14 168 lb 3.4 oz (76.3 kg)    General: Vital signs reviewed and noted.   Head: Normocephalic, atraumatic.  Eyes: conjunctivae/corneas clear.  EOM's intact.   Throat: normal  Neck:  normal   Lungs:    clear  Heart:  Rr  Abdomen:  Soft, non-tender, non-distended    Extremities: No edema. Pulses are 2+  , right radial cath site looks good   Neurologic: A&O X3, CN II - XII are grossly intact.   Psych: Normal     Labs: BMET:  Recent Labs  05/08/14 0512 05/09/14 0242  NA 143 137  K 4.0 3.7  CL 103 101  CO2 27 24  GLUCOSE 104* 97  BUN 14 14  CREATININE 0.79 0.77  CALCIUM 9.2 8.9    Liver function tests:  Recent Labs  05/07/14 2108  AST 22  ALT 27  ALKPHOS 73  BILITOT 0.7  PROT 7.9  ALBUMIN 4.3    Recent Labs  05/07/14 2108  LIPASE 19    CBC:  Recent Labs  05/07/14 2108 05/08/14 0512 05/09/14 0242  WBC 5.8 5.6 6.7  NEUTROABS 3.3  --   --   HGB 14.4 13.9 13.2  HCT 42.7 40.4 39.3  MCV 91.8 90.2 90.8  PLT 262 240 221     Cardiac Enzymes:  Recent Labs  05/07/14 2043 05/08/14 0512  TROPONINI 0.57* 1.03*    Coagulation Studies:  Recent Labs  05/07/14 2108  LABPROT 13.5  INR 1.03    Other: No components found with this basename: POCBNP,  No results found for this basename: DDIMER,  in the last 72 hours  Recent Labs  05/08/14 0512  HGBA1C 5.7*    Recent Labs  05/08/14 0528  CHOL 231*  HDL 53  LDLCALC 158*  TRIG 102  CHOLHDL 4.4   No results found for this basename: TSH, T4TOTAL, FREET3, T3FREE, THYROIDAB,  in the last 72 hours No results found for this basename: VITAMINB12, FOLATE, FERRITIN, TIBC, IRON, RETICCTPCT,  in the last 72 hours   Other results:  EKG :       Medications:    Infusions:    Scheduled Medications: . aspirin EC  81 mg Oral Daily  . atorvastatin  80 mg Oral q1800  . carvedilol  6.25 mg Oral BID WC  . sodium chloride  3  mL Intravenous Q12H  . ticagrelor  90 mg Oral BID    Assessment/ Plan:   Active Problems:   NSTEMI (non-ST elevated myocardial infarction) s/p PCI of LCx   Will need printed scrips We've discussed taking  ASA and Brilinta for a year Will need atorvastatin 80 a day SL NTG Coreg  2. Hyperlipidemia  - was started on Atorva 80   Disposition:  DC to home  Ok to travel to [place] this weekend   Length of Stay: 2     Vesta MixerPhilip J. Nahser, Montez HagemanJr., MD, [facility] 05/09/2014, 9:39 AM Office [phone] Pager [phone]

## 2014-05-09 NOTE — Care Management Note (Signed)
    Page 1 of 1   05/09/2014     1:55:03 PM CARE MANAGEMENT NOTE 05/09/2014  Patient:  Carman ChingBRACCIALE,Taydon   Account Number:  [id number]  Date Initiated:  05/09/2014  Documentation initiated by:  Junius CreamerWELL,DEBBIE  Subjective/Objective Assessment:   adm w mi     Action/Plan:   lives at home   Anticipated DC Date:  05/09/2014   Anticipated DC Plan:  HOME/SELF CARE      DC Planning Services  Medication Assistance      Choice offered to / List presented to:             Status of service:   Medicare Important Message given?   (If response is "NO", the following Medicare IM given date fields will be blank) Date Medicare IM given:   Medicare IM given by:   Date Additional Medicare IM given:   Additional Medicare IM given by:    Discharge Disposition:  HOME/SELF CARE  Per UR Regulation:  Reviewed for med. necessity/level of care/duration of stay  If discussed at Long Length of Stay Meetings, dates discussed:    Comments:  10/20  1354 debbie Adien Kimmel rn,bsn pt has brilint 30day free and copay assist card. he has ins.

## 2014-05-09 NOTE — Progress Notes (Addendum)
Patient discharged per written orders. Discharge instructions/follow up care/medications/follow up appointments discussed as well as signs and symptoms that would warrant a call to 911 or a return to the ED discussed. Time allowed for questions and concerns. Per Dr. Elease HashimotoNahser patient records printed for patient to take with him on his return to [place]. Awaiting CD from the Cath Lab with cath procedure data. Pt verbalized understanding of all instructions and states he has no questions at this time. Patient able to ambulate independently, dress self, pack belongs, etc with no assistance. IVs discontinued, patient removed from telemetry. CCMD notified.  Pt will leave the facility once he has the CD from the cath lab. Will continue to monitor until then.  Asher Muir- Keila Turan,RN   Patient discharged from the facility at 1400 today. I walked the patient (refused wheelchair) to the main entrance where he was picked up by a friend.  Asher Muir-Chisom Aust,RN

## 2014-05-09 NOTE — Discharge Instructions (Signed)

## 2014-05-09 NOTE — Discharge Summary (Signed)
Discharge Summary   Patient ID: George Rose,  MRN: [id number], DOB/AGE: 24-Feb-1966 48 y.o.  Admit date: 05/07/2014 Discharge date: 05/09/2014  Primary Care Provider: No primary provider on file. Primary Cardiologist: New - [place] is visiting from [place]  Discharge Diagnoses Principal Problem:   NSTEMI (non-ST elevated myocardial infarction)  - s/p drug eluting stent to mid left circumflex artery, EF 55% Active Problems:   Hyperlipemia   Allergies No Known Allergies  Procedures  Cardiac catheterization 05/08/2014 Procedure: Left Heart Cath, Selective Coronary Angiography, LV angiography, PTCA and stenting of the mid-left circumflex  PROCEDURAL FINDINGS  Hemodynamics:  AO 108/73  LV 112/8   Coronary angiography:  Coronary dominance: right   Left mainstem: Widely patent without obstructive disease   Left anterior descending (LAD): Wraps the LV apex. The LAD just after the first SP has 30% stenosis. The first diag is large without obstructive disease. No significant stenosis throughout the course of the LAD   Left circumflex (LCx): Large vessel with 99% mid stenosis extending into the first OM. No other significant disease noted.   Right coronary artery (RCA): Medium caliber, dominant vessel. The mid-vessel has mild irregularity without high-grade obstruction.   Left ventriculography: Left ventricular systolic function is low-normal, LVEF is estimated at 55%, there is no significant mitral regurgitation. There is mild hypokinesis of the distal inferior wall noted.   PCI Note: Following the diagnostic procedure, the decision was made to proceed with PCI of the mid-LCx. The patient was loaded with brilinta 180 mg. Weight-based bivalirudin was given for anticoagulation. Once a therapeutic ACT was achieved, a 6 [place] XB-LAD guide catheter was inserted. A cougar coronary guidewire was used to cross the lesion. The lesion was predilated with a 2.5 mm balloon. The lesion was  then stented with a 3.5x20 mm Promus DES. The stent appeared well-sized to the vessel, but there was mild slow flow following stent deployment. IC verapamil was administered. Following PCI, there was 0% residual stenosis and TIMI-3 flow. Final angiography confirmed an excellent result. The patient tolerated the procedure well. There were no immediate procedural complications. A TR band was used for radial hemostasis. The patient was transferred to the post catheterization recovery area for further monitoring.   PCI Data:  Vessel - LCx/Segment - mid  Percent Stenosis (pre) 99  TIMI-flow 3  Stent 3.5x20 mm Promus DES  Percent Stenosis (post) 0  TIMI-flow (post) 3   Contrast: 125 cc Omnipaque  Estimated Blood Loss: minimal   Final Conclusions:  1. Severe single vessel CAD of the left circumflex, treated successfully with a DES platform  2. Low-normal LV function  Recommendations:  Aggressive risk reduction, 12 months DAPT with ASA and brilinta, anticipate discharge home tomorrow.      Hospital Course  The patient is a 48 year old [place] without significant past medical history other than hyperlipidemia who [facility] on the night of 05/07/2014 with chest pressure and was found to have NSTEMI. According to the patient, he had upper respiratory infection (URI) for the last several days, he woke up in the morning of 05/07/2014 with a lump his chest which he thought was related to the URI. However this pressure in his chest continued to wax and wane throughout the entire day which prompted the patient to seek medical attention at local ER. [facility] showed ST depression in the lateral leads as well as lead II. Troponin was positive. IV heparin and aspirin was started, and  he was transferred to [facility] for [facility] evaluation. On arrival, he was chest pain-free. His blood pressure was mildly elevated, low-dose carvedilol was started.  Overnight, his troponin trended up from 0.57 to 1.03. Lipid profile showed cholesterol 231, triglycerides 102, LDL 158, HDL 53. Hemoglobin A1C was 5.7. Chest x-ray showed no acute progress.   He underwent the planned cardiac catheterization on 05/08/2014 which showed single-vessel disease with 99% mid left circumflex stenosis extending into the first OM. The lesion was treated with 3.5x20 mm Promus DES with resultant TIMI grade 3 flow after the procedure. His ejection fraction was noted to be 55% during cath. Post procedure, right radial cath site appears to be stable without significant bleeding or hematoma. He was placed on aspirin, Brilinta, Lipitor and Coreg. He was seen in the morning of 05/09/2014, at which time he denies any significant chest discomfort or shortness of breath, he is deemed stable for discharge from cardiology perspective. According to the patient, he has seen a cardiologist 3 years ago in [place], he has been instructed to followup with his cardiologist after returning to [place] this weekend. All prescription has been printed out for him. He has been seen by cardiac rehabilitation and appropriate teaching was given.   Discharge Vitals Blood pressure 118/73, pulse 70, temperature 98 F (36.7 C), temperature source Oral, resp. rate 15, height 6' (1.829 m), weight 168 lb 3.4 oz (76.3 kg), SpO2 100.00%.  Filed Weights   05/07/14 2047 05/08/14 0217  Weight: 175 lb (79.379 kg) 168 lb 3.4 oz (76.3 kg)    Labs  CBC  Recent Labs  05/07/14 2108 05/08/14 0512 05/09/14 0242  WBC 5.8 5.6 6.7  NEUTROABS 3.3  --   --   HGB 14.4 13.9 13.2  HCT 42.7 40.4 39.3  MCV 91.8 90.2 90.8  PLT 262 240 221   Basic Metabolic Panel  Recent Labs  05/08/14 0512 05/09/14 0242  NA 143 137  K 4.0 3.7  CL 103 101  CO2 27 24  GLUCOSE 104* 97  BUN 14 14  CREATININE 0.79 0.77  CALCIUM 9.2 8.9   Liver Function Tests  Recent Labs  05/07/14 2108  AST 22  ALT 27  ALKPHOS 73  BILITOT 0.7   PROT 7.9  ALBUMIN 4.3    Recent Labs  05/07/14 2108  LIPASE 19   Cardiac Enzymes  Recent Labs  05/07/14 2043 05/08/14 0512  TROPONINI 0.57* 1.03*   Hemoglobin A1C  Recent Labs  05/08/14 0512  HGBA1C 5.7*   Fasting Lipid Panel  Recent Labs  05/08/14 0528  CHOL 231*  HDL 53  LDLCALC 158*  TRIG 102  CHOLHDL 4.4    Disposition  Pt is being discharged home today in good condition.  Follow-up Plans & Appointments      Follow-up Information   Please follow up. (Patient has been instructed to follow up with his cardiologist in [place], preferrably within 2-4 weeks after discharge)       Discharge Medications    Medication List         aspirin 81 MG EC tablet  Take 1 tablet (81 mg total) by mouth daily.     atorvastatin 80 MG tablet  Commonly known as:  LIPITOR  Take 1 tablet (80 mg total) by mouth daily at 6 PM.     carvedilol 6.25 MG tablet  Commonly known as:  COREG  Take 1 tablet (6.25 mg total) by mouth 2 (two) times daily with a meal.  ibuprofen 200 MG tablet  Commonly known as:  ADVIL,MOTRIN  Take 400 mg by mouth every 6 (six) hours as needed for headache.     nitroGLYCERIN 0.4 MG SL tablet  Commonly known as:  NITROSTAT  Place 1 tablet (0.4 mg total) under the tongue every 5 (five) minutes x 3 doses as needed for chest pain.     ticagrelor 90 MG Tabs tablet  Commonly known as:  BRILINTA  Take 1 tablet (90 mg total) by mouth 2 (two) times daily.         Duration of Discharge Encounter   Greater than 30 minutes including physician time.  Ramond DialSigned, Meng, Hao PA-C Pager: [phone] 05/09/2014, 10:55 AM   Attending Note:   The patient was seen and examined.  Agree with assessment and plan as noted above.  Changes made to the above note as needed.  Patient has done well s/p PCI of LCX. Discussed meds and treatment plan  He will be going back to [place] this weekend.     Vesta MixerPhilip J. Nahser, Montez HagemanJr., MD, [facility] CenterFACC 05/09/2014, 1:31  PM [address],  [address] Office [phone] Pager [phone]

## 2014-06-29 ENCOUNTER — Encounter (HOSPITAL_COMMUNITY): Payer: Self-pay | Admitting: Cardiovascular Disease

## 2015-11-27 IMAGING — CR DG CHEST 2V
2 series · 2 of 2 positions shown · non-contrast
Comparison: None.

CLINICAL DATA: 48-year-old male with cough x2 weeks. Intermittent
chest pain.

EXAM:
CHEST - 2 VIEW

[w chest pa]
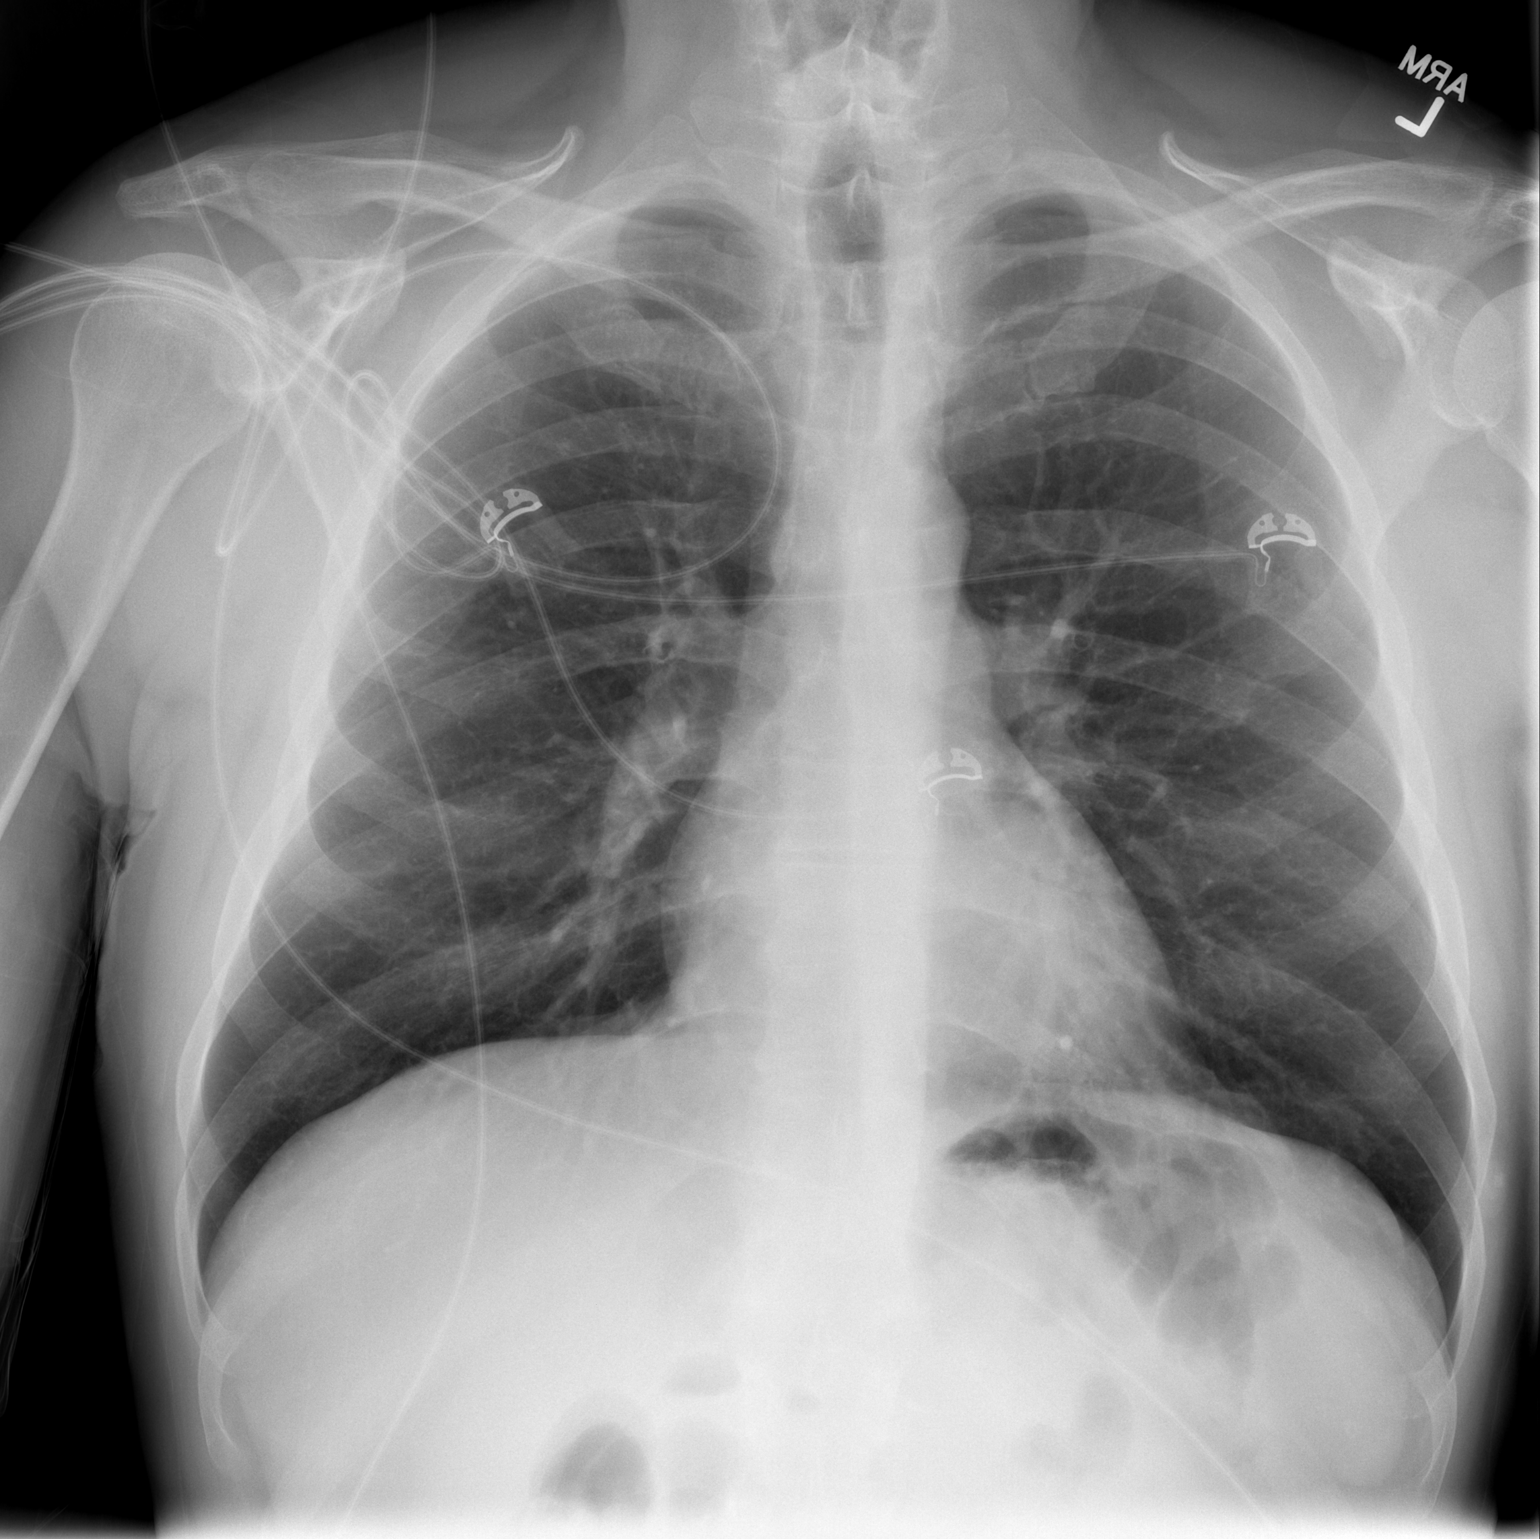

[w chest lat]
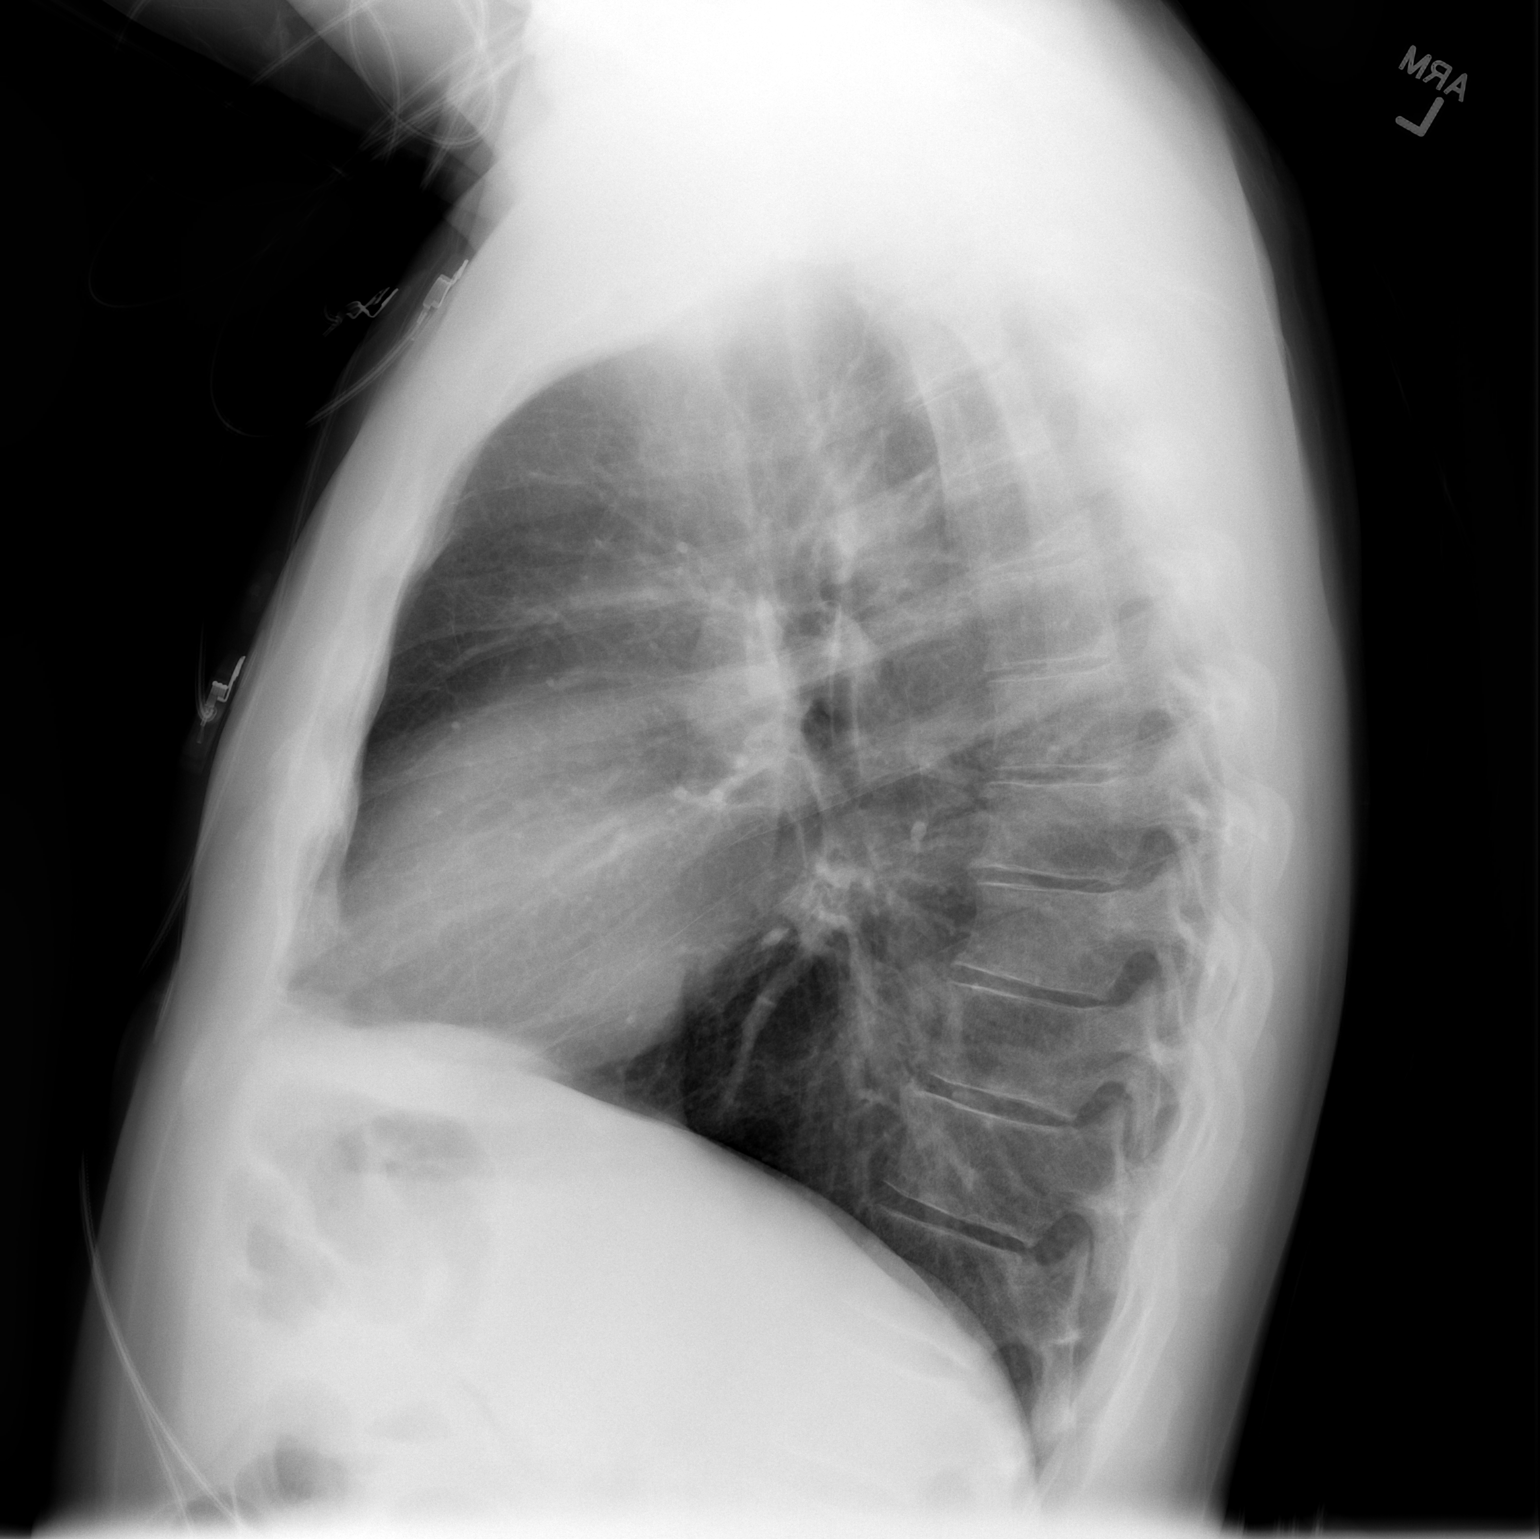

[2 of 2 positions shown; findings below may reference images not displayed]

FINDINGS: Cardiomediastinal within normal limits in size and contour. No
pulmonary vascular congestion.

No visualized pneumothorax or pleural effusion. No confluent
airspace disease.

No acute bony abnormality. Unremarkable appearance of the upper
abdomen.
IMPRESSION: No radiographic evidence of acute cardiopulmonary disease.
